# Patient Record
Sex: Male | Born: 1985 | Race: Black or African American | Hispanic: No | Marital: Single | Smoking: Current every day smoker
Health system: Southern US, Community
[De-identification: ages and names within clinical notes are randomized; demographics above are authoritative.]

---
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
## 2004-12-09 ENCOUNTER — Emergency Department (HOSPITAL_COMMUNITY): Admission: EM | Admit: 2004-12-09 | Discharge: 2004-12-10 | Payer: Self-pay | Admitting: *Deleted

## 2006-06-13 ENCOUNTER — Emergency Department (HOSPITAL_COMMUNITY): Admission: EM | Admit: 2006-06-13 | Discharge: 2006-06-13 | Payer: Self-pay | Admitting: Emergency Medicine

## 2006-06-15 ENCOUNTER — Emergency Department (HOSPITAL_COMMUNITY): Admission: EM | Admit: 2006-06-15 | Discharge: 2006-06-15 | Payer: Self-pay | Admitting: Emergency Medicine
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
## 2008-11-22 IMAGING — CR DG ABDOMEN ACUTE W/ 1V CHEST
3 series · 3 of 3 positions shown · non-contrast
Comparison: none

CLINICAL DATA: 21-year-old with stomach pain and weakness.  
 ACUTE ABDOMINAL SERIES - 3 VIEW:

[w chest pa]
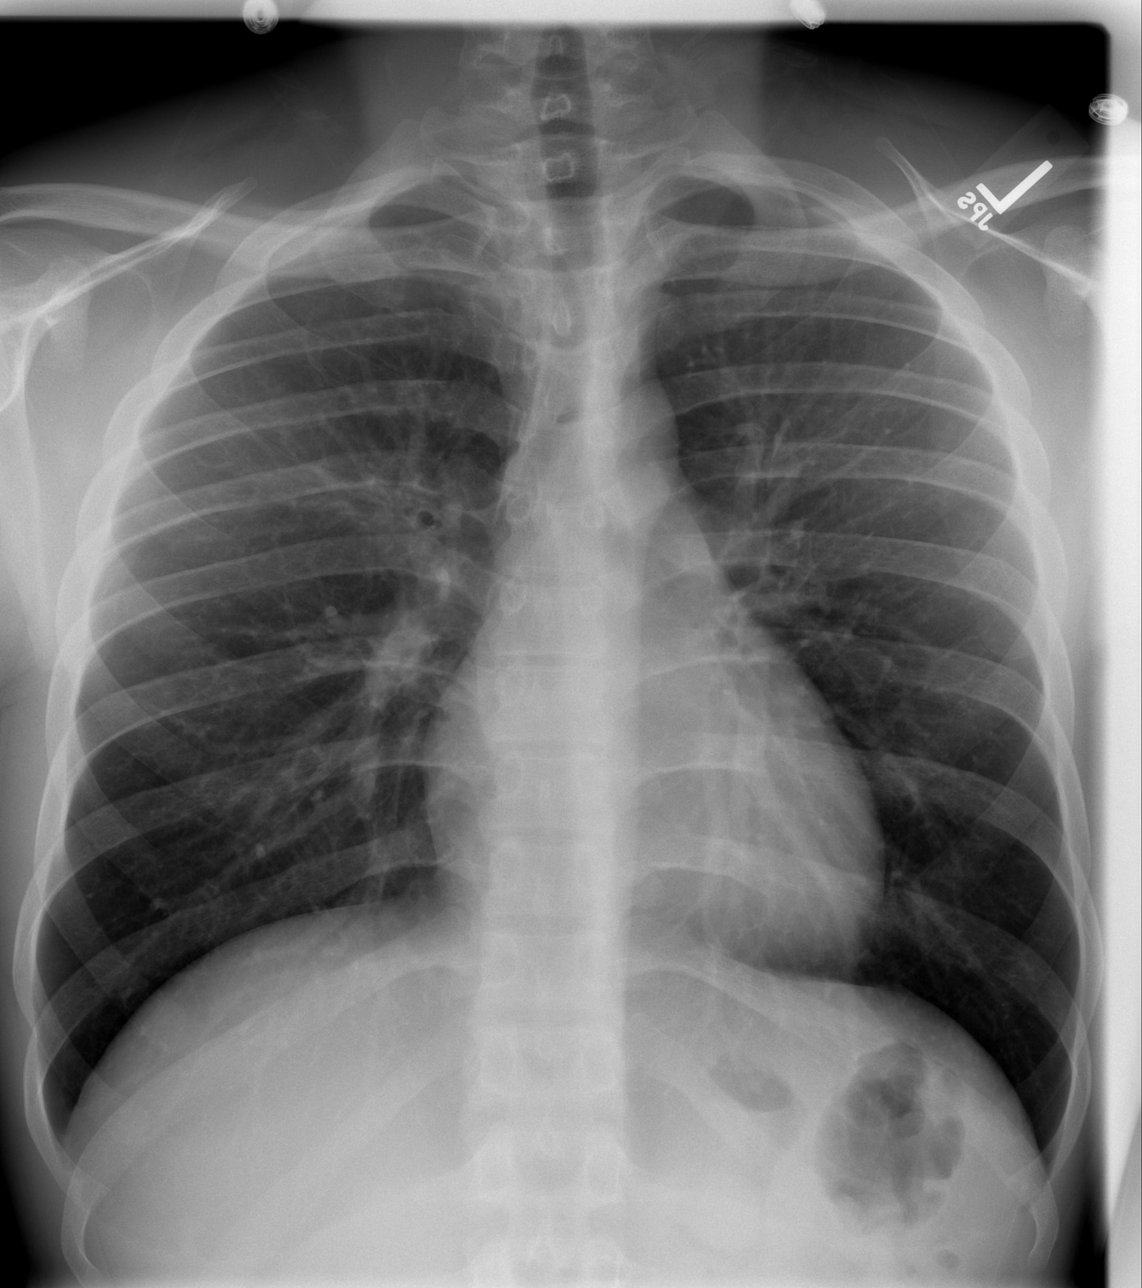

[w abdomen upright]
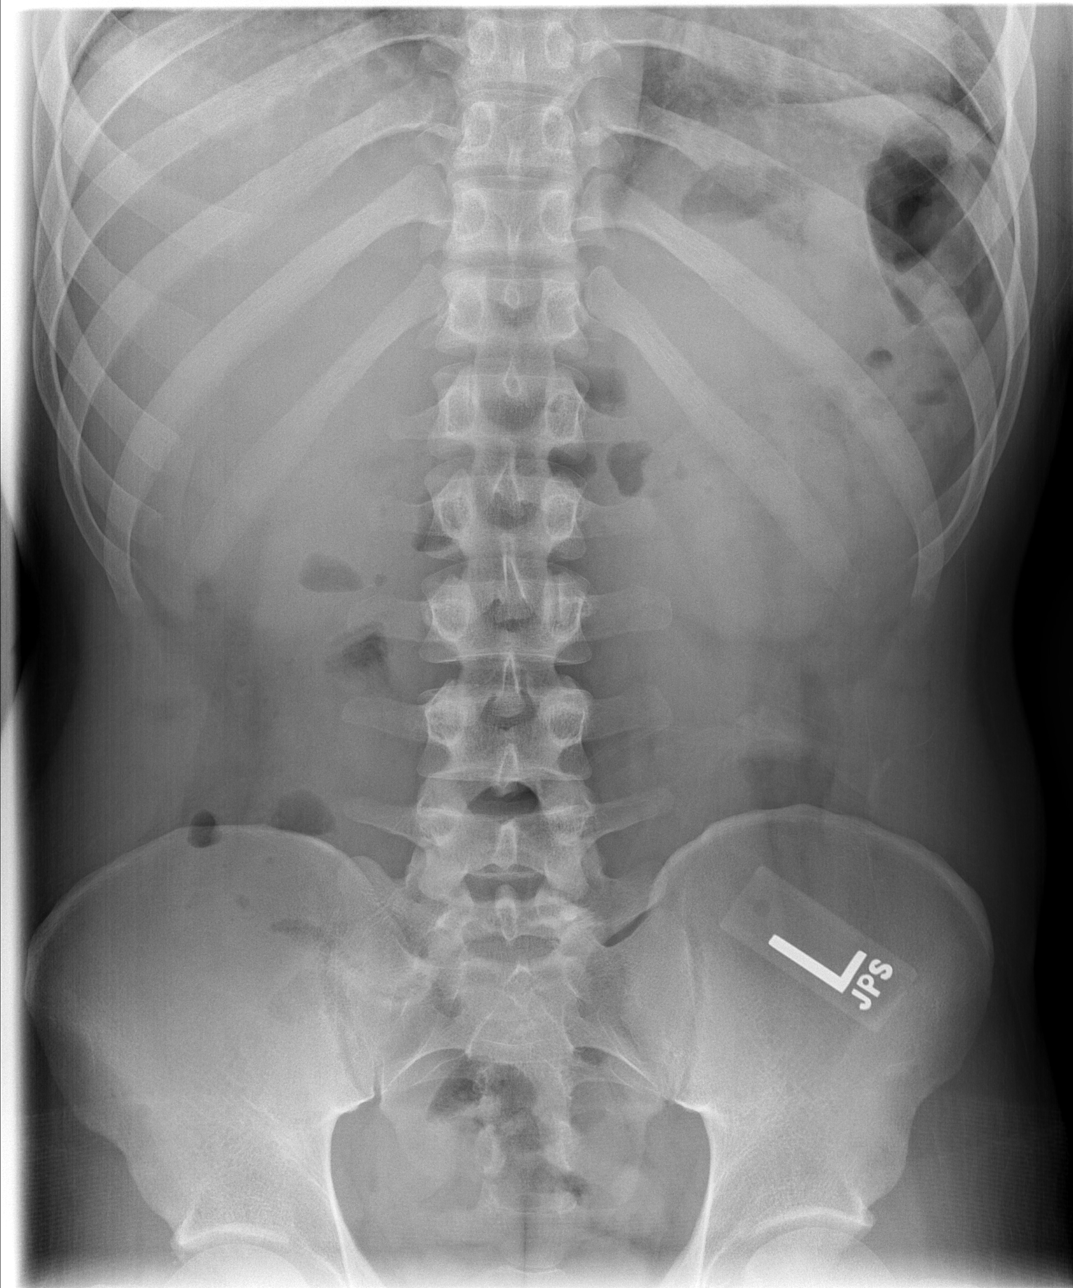

[t abdomen supine]
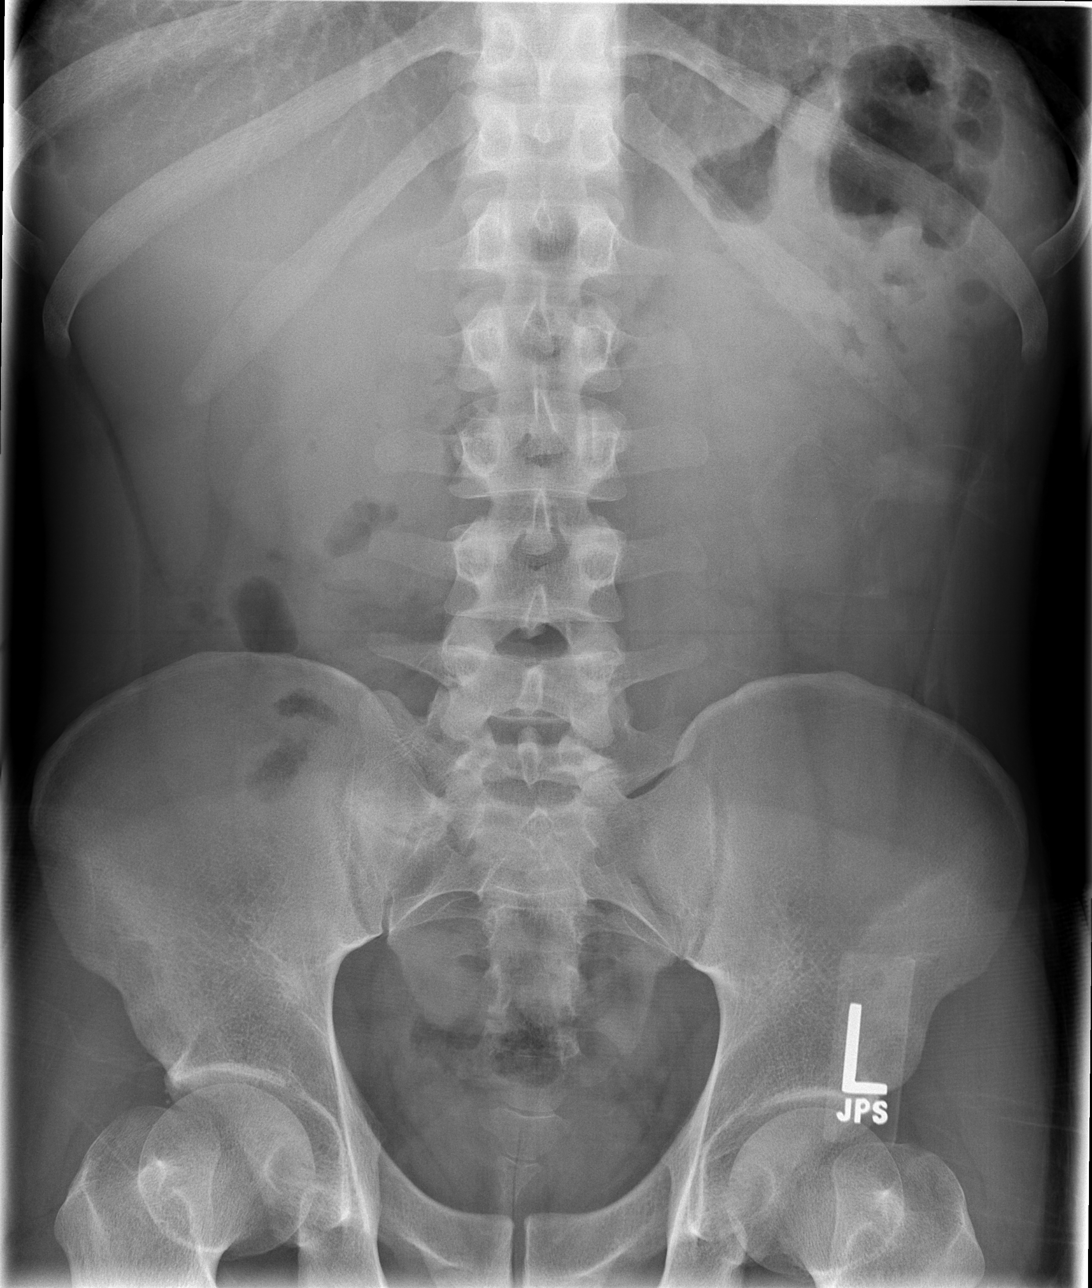

[3 of 3 positions shown; findings below may reference images not displayed]

FINDINGS: The chest radiograph demonstrates clear lungs.  The heart and mediastinum are normal.  The trachea is midline.  No evidence for free air.  The abdominal bowel gas pattern is nonspecific.  Negative for large abdominal calcifications.  The bone structures appear intact.  The S1 vertebral bodies appear to be transitional vertebral bodies.
IMPRESSION: 1.  No acute chest findings. 
 2.  Nonspecific bowel gas pattern.

## 2013-04-06 ENCOUNTER — Emergency Department (HOSPITAL_COMMUNITY)
Admission: EM | Admit: 2013-04-06 | Discharge: 2013-04-06 | Disposition: A | Discharge status: Home / Self Care | Payer: Self-pay | Attending: Emergency Medicine | Admitting: Emergency Medicine

## 2013-04-06 ENCOUNTER — Encounter (HOSPITAL_COMMUNITY): Payer: Self-pay | Admitting: Emergency Medicine

## 2013-04-06 DIAGNOSIS — J069 Acute upper respiratory infection, unspecified: Secondary | ICD-10-CM | POA: Insufficient documentation

## 2013-04-06 DIAGNOSIS — M545 Low back pain, unspecified: Secondary | ICD-10-CM | POA: Insufficient documentation

## 2013-04-06 DIAGNOSIS — F17200 Nicotine dependence, unspecified, uncomplicated: Secondary | ICD-10-CM | POA: Insufficient documentation

## 2013-04-06 DIAGNOSIS — M549 Dorsalgia, unspecified: Secondary | ICD-10-CM

## 2013-04-06 DIAGNOSIS — G8929 Other chronic pain: Secondary | ICD-10-CM | POA: Insufficient documentation

## 2013-04-06 MED ORDER — HYDROCODONE-HOMATROPINE 5-1.5 MG/5ML PO SYRP: 5.0000 mL | ORAL_SOLUTION | Freq: Four times a day (QID) | ORAL | Status: AC | PRN

## 2013-04-06 MED ORDER — NAPROXEN 500 MG PO TABS: 500.0000 mg | ORAL_TABLET | Freq: Two times a day (BID) | ORAL | Status: AC

## 2013-04-06 MED ORDER — OXYMETAZOLINE HCL 0.05 % NA SOLN: NASAL | Status: AC

## 2013-04-06 NOTE — ED Provider Notes (Signed)
CSN: [id number]     Arrival date & time 04/06/13  1454 History  This chart was scribed for non-physician practitioner, Arthor Captain, PA-C,working with Juliet Rude. Rubin Payor, MD, by Karle Plumber, ED Scribe.  This patient was seen in room WTR6/WTR6 and the patient's care was started at 4:51 PM.  Chief Complaint  Patient presents with  . Cough  . Nasal Congestion   The history is provided by the patient. No language interpreter was used.   HPI Comments:  John Gould is a 28 y.o. male who presents to the Emergency Department complaining of nasal congestion and productive cough that started approximately two days ago. Pt reports lower right-sided back pain that has been going on for approximately two months. He reports the pain radiates down his right leg. 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 He denies taking anything for his cold symptoms or pain. He denies weakness, numbness, tingling, bowel or bladder incontinence, fever, chills, sore throat, photophobia, or IV drug use. Pt states he does not have a PCP.  History reviewed. No pertinent past medical history. History reviewed. No pertinent past surgical history. No family history on file. History  Substance Use Topics  . Smoking status: Current Every Day Smoker -- 0.50 packs/day for 15 years    Types: Cigarettes  . Smokeless tobacco: Never Used  . Alcohol Use: Yes     Comment: Socially    Review of Systems  Constitutional: Negative for fever.  HENT: Positive for congestion.   Respiratory: Positive for cough.   Musculoskeletal: Positive for back pain (chronic). Negative for gait problem.  Skin: Negative for rash.  Neurological: Negative for weakness.  All other systems reviewed and are negative.   Allergies  Review of patient's allergies indicates no known allergies.  Home Medications  No current outpatient prescriptions on file. Triage Vitals: BP 102/58  Pulse 63  Temp(Src) 97.5 F (36.4 C) (Oral)  Resp 18  SpO2  98% Physical Exam  Nursing note and vitals reviewed. Constitutional: He is oriented to person, place, and time. He appears well-developed and well-nourished. No distress.  HENT:  Head: Normocephalic and atraumatic.  Right Ear: Hearing, tympanic membrane, external ear and ear canal normal.  Left Ear: Hearing, tympanic membrane, external ear and ear canal normal.  Mouth/Throat: Uvula is midline, oropharynx is clear and moist and mucous membranes are normal. No oropharyngeal exudate.  Mucosal erythema and edema.  Eyes: EOM are normal.  Neck: Normal range of motion.  Cardiovascular: Normal rate, regular rhythm and normal heart sounds.  Exam reveals no gallop and no friction rub.   No murmur heard. Pulmonary/Chest: Effort normal and breath sounds normal. No respiratory distress. He has no wheezes. He has no rales. He exhibits no tenderness.  Musculoskeletal: Normal range of motion. He exhibits no edema and no tenderness.  No midline tenderness. Thick spasmed thoracic and lumbar rectors.   Lymphadenopathy:    He has cervical adenopathy.  Neurological: He is alert and oriented to person, place, and time. He has normal reflexes.  Skin: Skin is warm and dry.  Psychiatric: He has a normal mood and affect. His behavior is normal.    ED Course  Procedures (including critical care time) DIAGNOSTIC STUDIES: Oxygen Saturation is 98% on RA, normal by my interpretation.   COORDINATION OF CARE: 5:00 PM- Will prescribe antiinflammatory medications and referral to orthopedics and massage therapy. Suggested PT. Symptomatic treatment for URI. Pt verbalizes understanding and agrees to plan.  Medications - No data to display  Labs Review Labs Reviewed - No data to display Imaging Review No results found.   EKG Interpretation None      MDM   Final diagnoses:  None    Patient with back pain, likely intermittent sciatica.   No neurological deficits and normal neuro exam.  Patient can walk but  states is painful.  No loss of bowel or bladder control.  No concern for cauda equina.  No fever, night sweats, weight loss, h/o cancer, IVDU.  RICE protocol and pain medicine indicated and discussed with patient. F/u with ortho.  Patients symptoms are consistent with URI, likely viral etiology. Discussed that antibiotics are not indicated for viral infections. Pt will be discharged with symptomatic treatment.  Verbalizes understanding and is agreeable with plan. Pt is hemodynamically stable & in NAD prior to dc.   I personally performed the services described in this documentation, which was scribed in my presence. The recorded information has been reviewed and is accurate.    Arthor CaptainAbigail Delle Andrzejewski, PA-C 04/11/13 1148

## 2013-04-06 NOTE — ED Notes (Signed)
Pt reports having a productive cough that produces yellow sputum. Pt also reports nasal congestion and lower back pain. Pt reports chronic lower back pain.  Pt is A/O x4, in NAD, and vitals are WDL.

## 2013-04-06 NOTE — Discharge Instructions (Signed)
Massage therapy referral to Gwen PoundsKeith Riva or eleanor redding at [facility] [address] [phone] [id number] SEEK IMMEDIATE MEDICAL ATTENTION IF: New numbness, tingling, weakness, or problem with the use of your arms or legs.  Severe back pain not relieved with medications.  Change in bowel or bladder control.  Increasing pain in any areas of the body (such as chest or abdominal pain).  Shortness of breath, dizziness or fainting.  Nausea (feeling sick to your stomach), vomiting, fever, or sweats.   Read the instructions below on reasons to return to the emergency department and to learn more about your diagnosis.  Use over the counter medications for symptomatic relief as we discussed (musinex as a decongestant, Tylenol for fever/pain, Motrin/Ibuprofen for muscle aches). If prescribed a cough suppressant during your visit, do not operate heavy machinery with in 5 hours of taking this medication. Followup with your primary care doctor in 4 days if your symptoms persist.  Your more than welcome to return to the emergency department if symptoms worsen or become concerning.  Upper Respiratory Infection, Adult  An upper respiratory infection (URI) is also sometimes known as the common cold. Most people improve within 1 week, but symptoms can last up to 2 weeks. A residual cough may last even longer.   URI is most commonly caused by a virus. Viruses are NOT treated with antibiotics. You can easily spread the virus to others by oral contact. This includes kissing, sharing a glass, coughing, or sneezing. Touching your mouth or nose and then touching a surface, which is then touched by another person, can also spread the virus.   TREATMENT  Treatment is directed at relieving symptoms. There is no cure. Antibiotics are not effective, because the infection is caused by a virus, not by bacteria. Treatment may include:  Increased fluid intake. Sports drinks offer valuable electrolytes, sugars, and fluids.  Breathing  heated mist or steam (vaporizer or shower).  Eating chicken soup or other clear broths, and maintaining good nutrition.  Getting plenty of rest.  Using gargles or lozenges for comfort.  Controlling fevers with ibuprofen or acetaminophen as directed by your caregiver.  Increasing usage of your inhaler if you have asthma.  Return to work when your temperature has returned to normal.   SEEK MEDICAL CARE IF:  After the first few days, you feel you are getting worse rather than better.  You develop worsening shortness of breath, or brown or red sputum. These may be signs of pneumonia.  You develop yellow or brown nasal discharge or pain in the face, especially when you bend forward. These may be signs of sinusitis.  You develop a fever, swollen neck glands, pain with swallowing, or white areas in the back of your throat. These may be signs of strep throat.

## 2013-04-11 NOTE — ED Provider Notes (Signed)
Medical screening examination/treatment/procedure(s) were performed by non-physician practitioner and as supervising physician I was immediately available for consultation/collaboration.   EKG Interpretation None       Emit Kuenzel R. Kris Burd, MD 04/11/13 1804
